# Patient Record
Sex: Male | Born: 1975 | Race: White | Hispanic: No | Marital: Single | Smoking: Current every day smoker
Health system: Southern US, Community
[De-identification: ages and names within clinical notes are randomized; demographics above are authoritative.]

## PROBLEM LIST (undated history)

## (undated) DIAGNOSIS — W3400XA Accidental discharge from unspecified firearms or gun, initial encounter: Secondary | ICD-10-CM

## (undated) HISTORY — PX: ANKLE FRACTURE SURGERY: SHX122

## (undated) HISTORY — PX: OTHER SURGICAL HISTORY: SHX169

---

## 1997-06-20 ENCOUNTER — Emergency Department (HOSPITAL_COMMUNITY): Admission: EM | Admit: 1997-06-20 | Discharge: 1997-06-20 | Payer: Self-pay | Admitting: *Deleted

## 1997-08-25 ENCOUNTER — Emergency Department (HOSPITAL_COMMUNITY): Admission: EM | Admit: 1997-08-25 | Discharge: 1997-08-25 | Payer: Self-pay | Admitting: Emergency Medicine

## 1998-02-14 ENCOUNTER — Emergency Department (HOSPITAL_COMMUNITY): Admission: EM | Admit: 1998-02-14 | Discharge: 1998-02-14 | Payer: Self-pay | Admitting: Emergency Medicine

## 1998-02-15 ENCOUNTER — Encounter: Payer: Self-pay | Admitting: Emergency Medicine

## 1998-02-17 ENCOUNTER — Emergency Department (HOSPITAL_COMMUNITY): Admission: EM | Admit: 1998-02-17 | Discharge: 1998-02-17 | Payer: Self-pay | Admitting: Emergency Medicine

## 1998-02-23 ENCOUNTER — Emergency Department (HOSPITAL_COMMUNITY): Admission: EM | Admit: 1998-02-23 | Discharge: 1998-02-23 | Payer: Self-pay | Admitting: Emergency Medicine

## 1998-02-27 ENCOUNTER — Emergency Department (HOSPITAL_COMMUNITY): Admission: EM | Admit: 1998-02-27 | Discharge: 1998-02-27 | Payer: Self-pay | Admitting: Emergency Medicine

## 2004-04-22 ENCOUNTER — Inpatient Hospital Stay (HOSPITAL_COMMUNITY): Admission: EM | Admit: 2004-04-22 | Discharge: 2004-04-23 | Payer: Self-pay | Admitting: Emergency Medicine

## 2004-07-07 ENCOUNTER — Emergency Department (HOSPITAL_COMMUNITY): Admission: EM | Admit: 2004-07-07 | Discharge: 2004-07-07 | Payer: Self-pay | Admitting: Emergency Medicine

## 2006-05-15 ENCOUNTER — Inpatient Hospital Stay (HOSPITAL_COMMUNITY): Admission: AC | Admit: 2006-05-15 | Discharge: 2006-05-26 | Payer: Self-pay

## 2006-05-23 ENCOUNTER — Ambulatory Visit: Payer: Self-pay | Admitting: Vascular Surgery

## 2007-03-20 ENCOUNTER — Inpatient Hospital Stay (HOSPITAL_COMMUNITY): Admission: RE | Admit: 2007-03-20 | Discharge: 2007-03-23 | Payer: Self-pay | Admitting: General Surgery
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
## 2010-11-15 LAB — BASIC METABOLIC PANEL WITH GFR
BUN: 16
CO2: 27
Calcium: 8.5
Chloride: 104
Creatinine, Ser: 0.92
GFR calc non Af Amer: >60
Glucose, Bld: 147 — ABNORMAL HIGH
Potassium: 4.5
Sodium: 136

## 2010-11-15 LAB — CBC
HCT: 37.5 — ABNORMAL LOW
HCT: 43.9
Hemoglobin: 13
Hemoglobin: 15.1
MCHC: 34.3
MCHC: 34.8
MCV: 95.1
MCV: 95.4
MCV: 95.6
Platelets: 132 — ABNORMAL LOW
Platelets: 161
Platelets: 162
RBC: 3.81 — ABNORMAL LOW
RBC: 3.95 — ABNORMAL LOW
RBC: 4.6
RDW: 14.1
RDW: 14.3
WBC: 13.8 — ABNORMAL HIGH
WBC: 6.4
WBC: 7.8

## 2010-11-15 LAB — DIFFERENTIAL
Basophils Absolute: 0
Basophils Absolute: 0
Basophils Relative: 0
Basophils Relative: 0
Eosinophils Absolute: 0
Eosinophils Absolute: 0.1
Eosinophils Relative: 0
Eosinophils Relative: 1
Lymphocytes Relative: 20
Lymphocytes Relative: 6 — ABNORMAL LOW
Lymphs Abs: 0.8
Lymphs Abs: 1.6
Monocytes Absolute: 0.9
Monocytes Absolute: 1.2 — ABNORMAL HIGH
Monocytes Relative: 12
Monocytes Relative: 9
Neutro Abs: 11.8 — ABNORMAL HIGH
Neutro Abs: 5.2
Neutrophils Relative %: 66
Neutrophils Relative %: 86 — ABNORMAL HIGH

## 2010-11-15 LAB — BASIC METABOLIC PANEL
Chloride: 95 — ABNORMAL LOW
Creatinine, Ser: 0.93
GFR calc Af Amer: >60
GFR calc non Af Amer: >60
Potassium: 3.6

## 2014-12-06 ENCOUNTER — Emergency Department (HOSPITAL_COMMUNITY): Payer: Self-pay

## 2014-12-06 ENCOUNTER — Emergency Department (HOSPITAL_COMMUNITY)
Admission: EM | Admit: 2014-12-06 | Discharge: 2014-12-06 | Disposition: A | Discharge status: Home / Self Care | Payer: Self-pay | Attending: Emergency Medicine | Admitting: Emergency Medicine

## 2014-12-06 ENCOUNTER — Encounter (HOSPITAL_COMMUNITY): Payer: Self-pay | Admitting: Emergency Medicine

## 2014-12-06 DIAGNOSIS — Y9389 Activity, other specified: Secondary | ICD-10-CM | POA: Insufficient documentation

## 2014-12-06 DIAGNOSIS — Z720 Tobacco use: Secondary | ICD-10-CM | POA: Insufficient documentation

## 2014-12-06 DIAGNOSIS — W1842XA Slipping, tripping and stumbling without falling due to stepping into hole or opening, initial encounter: Secondary | ICD-10-CM | POA: Insufficient documentation

## 2014-12-06 DIAGNOSIS — S8261XA Displaced fracture of lateral malleolus of right fibula, initial encounter for closed fracture: Secondary | ICD-10-CM | POA: Insufficient documentation

## 2014-12-06 DIAGNOSIS — Y9289 Other specified places as the place of occurrence of the external cause: Secondary | ICD-10-CM | POA: Insufficient documentation

## 2014-12-06 DIAGNOSIS — Y998 Other external cause status: Secondary | ICD-10-CM | POA: Insufficient documentation

## 2014-12-06 MED ORDER — DIPHENHYDRAMINE HCL 25 MG PO CAPS
25.0000 mg | ORAL_CAPSULE | Freq: Once | ORAL | Status: AC
  Administered 2014-12-06: 25 mg via ORAL
  Filled 2014-12-06: qty 1

## 2014-12-06 MED ORDER — HYDROCODONE-ACETAMINOPHEN 5-325 MG PO TABS: 1.0000 | ORAL_TABLET | Freq: Four times a day (QID) | ORAL | Status: DC | PRN

## 2014-12-06 MED ORDER — OXYCODONE-ACETAMINOPHEN 5-325 MG PO TABS
1.0000 | ORAL_TABLET | Freq: Once | ORAL | Status: AC
Start: 2014-12-06 — End: 2014-12-06
  Administered 2014-12-06: 1 via ORAL
  Filled 2014-12-06: qty 1

## 2014-12-06 MED ORDER — IBUPROFEN 400 MG PO TABS
800.0000 mg | ORAL_TABLET | Freq: Once | ORAL | Status: AC
  Administered 2014-12-06: 800 mg via ORAL
  Filled 2014-12-06: qty 2

## 2014-12-06 MED ORDER — NAPROXEN 500 MG PO TABS: 500.0000 mg | ORAL_TABLET | Freq: Two times a day (BID) | ORAL | Status: DC

## 2014-12-06 NOTE — ED Notes (Signed)
Pt sts right ankle pain and swelling after twisting last night

## 2014-12-06 NOTE — Progress Notes (Signed)
Orthopedic Tech Progress Note Patient Details:  Christopher Sheppard 07/20/1975 [id number]  Ortho Devices Type of Ortho Device: Ace wrap, Post (short leg) splint Ortho Device/Splint Location: RLE Ortho Device/Splint Interventions: Ordered, Application Patient has own crutches  [name] 12/06/2014, 9:26 PM
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
## 2014-12-06 NOTE — ED Provider Notes (Signed)
CSN: [id number]     Arrival date & time 12/06/14  1746 History  By signing my name below, I, Ronney Lion, attest that this documentation has been prepared under the direction and in the presence of Kerrie Buffalo, NP. Electronically Signed: Ronney Lion, ED Scribe. 12/06/2014. 8:12 PM.    Chief Complaint  Patient presents with  . Ankle Pain   Patient is a 39 y.o. male presenting with ankle pain. The history is provided by the patient. No language interpreter was used.  Ankle Pain Location:  Ankle Ankle location:  R ankle Pain details:    Quality:  Sharp and shooting   Radiates to:  Does not radiate   Severity:  Moderate   Onset quality:  Sudden   Timing:  Constant   Progression:  Worsening Chronicity:  New Dislocation: no   Relieved by:  Ice and elevation Worsened by:  Nothing tried Ineffective treatments:  None tried Associated symptoms: swelling    HPI Comments: PATSY VARMA is a 39 y.o. male who presents to the Emergency Department complaining of sudden-onset right ankle pain and swelling after stepping in a hole and rolling his ankle last night, about 20 hours ago - he reports he immediately heard a "snap." Patient reports he walked a mile afterwards. He states he has been elevating and icing the area with mild relief. Patient reports he last saw orthopedist Dr. Rennis Chris, 8-10 years ago.  History reviewed. No pertinent past medical history. History reviewed. No pertinent past surgical history. History reviewed. No pertinent family history. Social History  Substance Use Topics  . Smoking status: Current Every Day Smoker  . Smokeless tobacco: None  . Alcohol Use: Yes    Review of Systems  Musculoskeletal: Positive for arthralgias (right ankle pain).  All other systems reviewed and are negative.   Allergies  Review of patient's allergies indicates no known allergies.  Home Medications   Prior to Admission medications   Medication Sig Start Date End Date Taking? Authorizing  Provider  HYDROcodone-acetaminophen (NORCO) 5-325 MG tablet Take 1-2 tablets by mouth every 6 (six) hours as needed. 12/06/14   Yesenia Locurto Orlene Och, NP  naproxen (NAPROSYN) 500 MG tablet Take 1 tablet (500 mg total) by mouth 2 (two) times daily. 12/06/14   Tacoya Altizer Orlene Och, NP   BP 134/71 mmHg  Pulse 85  Temp(Src) 98.4 F (36.9 C) (Oral)  Resp 16  SpO2 99% Physical Exam  Constitutional: He is oriented to person, place, and time. He appears well-developed and well-nourished. No distress.  HENT:  Head: Normocephalic and atraumatic.  Eyes: Conjunctivae and EOM are normal.  Neck: Neck supple. No tracheal deviation present.  Cardiovascular: Normal rate.   Pulmonary/Chest: Effort normal. No respiratory distress.  Musculoskeletal:       Right ankle: He exhibits decreased range of motion and swelling. He exhibits no laceration and normal pulse. Tenderness. Lateral malleolus tenderness found. Achilles tendon normal.  2+ pulses. Adequate circulation.   Neurological: He is alert and oriented to person, place, and time.  Skin: Skin is warm and dry.  Psychiatric: He has a normal mood and affect. His behavior is normal.  Nursing note and vitals reviewed.   ED Course  Procedures (including critical care time) Consult with Dr. August Saucer, ortho,  Posterior splint, crutches, ice, elevation and call the office for appointment to be seen on Thursday 12/08/14  DIAGNOSTIC STUDIES: Oxygen Saturation is 100% on RA, normal by my interpretation.    COORDINATION OF CARE: 7:27 PM - Discussed  treatment plan with pt at bedside which includes pain medication and f/u with [facility]. Pt verbalized understanding and agreed to plan.   Imaging Review Dg Ankle Complete Right  12/06/2014  CLINICAL DATA:  39 year old male twisting injury last night with pain. Previous right tibia fracture with ORIF. Initial encounter. EXAM: RIGHT ANKLE - COMPLETE 3+ VIEW COMPARISON:  Intraoperative radiographs 04/22/2004 FINDINGS:  Right tibia intra medullary rod partially visible. Healed oblique/spiral fracture of the distal third right tibia shaft. Soft tissue swelling about the ankle with mildly comminuted transverse fracture through the lateral malleolus. Mortise joint alignment preserved. Small ankle joint effusion. Talar dome intact. Calcaneus intact. No other acute fracture. IMPRESSION: 1. Mildly comminuted minimally displaced transverse fracture lateral malleolus. Small joint effusion. 2. No other acute osseous abnormality identified. Electronically Signed   By: Odessa Fleming M.D.   On: 12/06/2014 18:51   I have personally reviewed and evaluated these images as part of my medical decision-making.  MDM  39 y.o. male with pain and swelling to the right lateral malleolus stable for d/c without neurovascular deficits and without blisters to the skin. Discussed with the patient clinical and x-ray findings and plan of care and all questioned fully answered.    Final diagnoses:  Lateral malleolar fracture, right, closed, initial encounter   I personally performed the services described in this documentation, which was scribed in my presence. The recorded information has been reviewed and is accurate.     [address] [place], [place] 12/07/14 2320  Linwood Dibbles, MD 12/08/14 2156

## 2014-12-06 NOTE — Discharge Instructions (Signed)
Call Dr. Diamantina Providence office in the morning and tell them you were seen in the ED with an ankle fracture and Dr. August Saucer wants you to follow up in the office on Thursday.  Ankle Fracture A fracture is a break in a bone. The ankle joint is made up of three bones. These include the lower (distal)sections of your lower leg bones, called the tibia and fibula, along with a bone in your foot, called the talus. Depending on how bad the break is and if more than one ankle joint bone is broken, a cast or splint is used to protect and keep your injured bone from moving while it heals. Sometimes, surgery is required to help the fracture heal properly.  There are two general types of fractures:  Stable fracture. This includes a single fracture line through one bone, with no injury to ankle ligaments. A fracture of the talus that does not have any displacement (movement of the bone on either side of the fracture line) is also stable.  Unstable fracture. This includes more than one fracture line through one or more bones in the ankle joint. It also includes fractures that have displacement of the bone on either side of the fracture line. CAUSES  A direct blow to the ankle.   Quickly and severely twisting your ankle.  Trauma, such as a car accident or falling from a significant height. RISK FACTORS You may be at a higher risk of ankle fracture if:  You have certain medical conditions.  You are involved in high-impact sports.  You are involved in a high-impact car accident. SIGNS AND SYMPTOMS   Tender and swollen ankle.  Bruising around the injured ankle.  Pain on movement of the ankle.  Difficulty walking or putting weight on the ankle.  A cold foot below the site of the ankle injury. This can occur if the blood vessels passing through your injured ankle were also damaged.  Numbness in the foot below the site of the ankle injury. DIAGNOSIS  An ankle fracture is usually diagnosed with a physical exam  and X-rays. A CT scan may also be required for complex fractures. TREATMENT  Stable fractures are treated with a cast or splint and using crutches to avoid putting weight on your injured ankle. This is followed by an ankle strengthening program. Some patients require a special type of cast, depending on other medical problems they may have. Unstable fractures require surgery to ensure the bones heal properly. Your health care provider will tell you what type of fracture you have and the best treatment for your condition. HOME CARE INSTRUCTIONS   Review correct crutch use with your health care provider and use your crutches as directed. Safe use of crutches is extremely important. Misuse of crutches can cause you to fall or cause injury to nerves in your hands or armpits.  Do not put weight or pressure on the injured ankle until directed by your health care provider.  To lessen the swelling, keep the injured leg elevated while sitting or lying down.  Apply ice to the injured area:  Put ice in a plastic bag.  Place a towel between your cast and the bag.  Leave the ice on for 20 minutes, 2-3 times a day.  If you have a plaster or fiberglass cast:  Do not try to scratch the skin under the cast with any objects. This can increase your risk of skin infection.  Check the skin around the cast every day. You may  put lotion on any red or sore areas.  Keep your cast dry and clean.  If you have a plaster splint:  Wear the splint as directed.  You may loosen the elastic around the splint if your toes become numb, tingle, or turn cold or blue.  Do not put pressure on any part of your cast or splint; it may break. Rest your cast only on a pillow the first 24 hours until it is fully hardened.  Your cast or splint can be protected during bathing with a plastic bag sealed to your skin with medical tape. Do not lower the cast or splint into water.  Take medicines as directed by your health care  provider. Only take over-the-counter or prescription medicines for pain, discomfort, or fever as directed by your health care provider.  Do not drive a vehicle until your health care provider specifically tells you it is safe to do so.  If your health care provider has given you a follow-up appointment, it is very important to keep that appointment. Not keeping the appointment could result in a chronic or permanent injury, pain, and disability. If you have any problem keeping the appointment, call the facility for assistance. SEEK MEDICAL CARE IF: You develop increased swelling or discomfort. SEEK IMMEDIATE MEDICAL CARE IF:   Your cast gets damaged or breaks.  You have continued severe pain.  You develop new pain or swelling after the cast was put on.  Your skin or toenails below the injury turn blue or gray.  Your skin or toenails below the injury feel cold, numb, or have loss of sensitivity to touch.  There is a bad smell or pus draining from under the cast. MAKE SURE YOU:   Understand these instructions.  Will watch your condition.  Will get help right away if you are not doing well or get worse.   This information is not intended to replace advice given to you by your health care provider. Make sure you discuss any questions you have with your health care provider.   Document Released: 02/09/2000 Document Revised: 02/16/2013 Document Reviewed: 09/10/2012 Elsevier Interactive Patient Education 2016 [employer]  Cast or Splint Care Casts and splints support injured limbs and keep bones from moving while they heal. It is important to care for your cast or splint at home.  HOME CARE INSTRUCTIONS  Keep the cast or splint uncovered during the drying period. It can take 24 to 48 hours to dry if it is made of plaster. A fiberglass cast will dry in less than 1 hour.  Do not rest the cast on anything harder than a pillow for the first 24 hours.  Do not put weight on your  injured limb or apply pressure to the cast until your health care provider gives you permission.  Keep the cast or splint dry. Wet casts or splints can lose their shape and may not support the limb as well. A wet cast that has lost its shape can also create harmful pressure on your skin when it dries. Also, wet skin can become infected.  Cover the cast or splint with a plastic bag when bathing or when out in the rain or snow. If the cast is on the trunk of the body, take sponge baths until the cast is removed.  If your cast does become wet, dry it with a towel or a blow dryer on the cool setting only.  Keep your cast or splint clean. Soiled casts may be wiped  with a moistened cloth.  Do not place any hard or soft foreign objects under your cast or splint, such as cotton, toilet paper, lotion, or powder.  Do not try to scratch the skin under the cast with any object. The object could get stuck inside the cast. Also, scratching could lead to an infection. If itching is a problem, use a blow dryer on a cool setting to relieve discomfort.  Do not trim or cut your cast or remove padding from inside of it.  Exercise all joints next to the injury that are not immobilized by the cast or splint. For example, if you have a long leg cast, exercise the hip joint and toes. If you have an arm cast or splint, exercise the shoulder, elbow, thumb, and fingers.  Elevate your injured arm or leg on 1 or 2 pillows for the first 1 to 3 days to decrease swelling and pain.It is best if you can comfortably elevate your cast so it is higher than your heart. SEEK MEDICAL CARE IF:   Your cast or splint cracks.  Your cast or splint is too tight or too loose.  You have unbearable itching inside the cast.  Your cast becomes wet or develops a soft spot or area.  You have a bad smell coming from inside your cast.  You get an object stuck under your cast.  Your skin around the cast becomes red or raw.  You have  new pain or worsening pain after the cast has been applied. SEEK IMMEDIATE MEDICAL CARE IF:   You have fluid leaking through the cast.  You are unable to move your fingers or toes.  You have discolored (blue or white), cool, painful, or very swollen fingers or toes beyond the cast.  You have tingling or numbness around the injured area.  You have severe pain or pressure under the cast.  You have any difficulty with your breathing or have shortness of breath.  You have chest pain.   This information is not intended to replace advice given to you by your health care provider. Make sure you discuss any questions you have with your health care provider.   Document Released: 02/09/2000 Document Revised: 12/02/2012 Document Reviewed: 08/20/2012 Elsevier Interactive Patient Education [employer]

## 2015-10-12 ENCOUNTER — Encounter (HOSPITAL_COMMUNITY): Payer: Self-pay | Admitting: Emergency Medicine

## 2015-10-12 ENCOUNTER — Emergency Department (HOSPITAL_COMMUNITY)
Admission: EM | Admit: 2015-10-12 | Discharge: 2015-10-12 | Disposition: A | Discharge status: Home / Self Care | Payer: Self-pay | Attending: Emergency Medicine | Admitting: Emergency Medicine

## 2015-10-12 ENCOUNTER — Emergency Department (HOSPITAL_COMMUNITY): Payer: Self-pay

## 2015-10-12 DIAGNOSIS — F17200 Nicotine dependence, unspecified, uncomplicated: Secondary | ICD-10-CM | POA: Insufficient documentation

## 2015-10-12 DIAGNOSIS — Y929 Unspecified place or not applicable: Secondary | ICD-10-CM | POA: Insufficient documentation

## 2015-10-12 DIAGNOSIS — Z23 Encounter for immunization: Secondary | ICD-10-CM | POA: Insufficient documentation

## 2015-10-12 DIAGNOSIS — Y9389 Activity, other specified: Secondary | ICD-10-CM | POA: Insufficient documentation

## 2015-10-12 DIAGNOSIS — M25571 Pain in right ankle and joints of right foot: Secondary | ICD-10-CM | POA: Insufficient documentation

## 2015-10-12 DIAGNOSIS — Y999 Unspecified external cause status: Secondary | ICD-10-CM | POA: Insufficient documentation

## 2015-10-12 DIAGNOSIS — S0001XA Abrasion of scalp, initial encounter: Secondary | ICD-10-CM | POA: Insufficient documentation

## 2015-10-12 HISTORY — DX: Accidental discharge from unspecified firearms or gun, initial encounter: W34.00XA

## 2015-10-12 MED ORDER — TETANUS-DIPHTH-ACELL PERTUSSIS 5-2.5-18.5 LF-MCG/0.5 IM SUSP
0.5000 mL | Freq: Once | INTRAMUSCULAR | Status: AC
  Administered 2015-10-12: 0.5 mL via INTRAMUSCULAR
  Filled 2015-10-12: qty 0.5

## 2015-10-12 MED ORDER — IBUPROFEN 800 MG PO TABS: 800.0000 mg | ORAL_TABLET | Freq: Three times a day (TID) | ORAL | 0 refills | Status: AC | PRN

## 2015-10-12 MED ORDER — IBUPROFEN 200 MG PO TABS
600.0000 mg | ORAL_TABLET | Freq: Once | ORAL | Status: AC
  Administered 2015-10-12: 600 mg via ORAL
  Filled 2015-10-12: qty 3

## 2015-10-12 NOTE — Discharge Instructions (Signed)
Read the information below.  Use the prescribed medication as directed.  Please discuss all new medications with your pharmacist.  You may return to the Emergency Department at any time for worsening condition or any new symptoms that concern you.    ° °You have had a head injury which does not appear to require admission at this time. A concussion is a state of changed mental ability from trauma. °SEEK IMMEDIATE MEDICAL ATTENTION IF: °There is confusion or drowsiness (although children frequently become drowsy after injury).  °You cannot awaken the injured person.  °There is nausea (feeling sick to your stomach) or continued, forceful vomiting.  °You notice dizziness or unsteadiness which is getting worse, or inability to walk.  °You have convulsions or unconsciousness.  °You experience severe, persistent headaches not relieved by Tylenol?. (Do not take aspirin as this impairs clotting abilities). Take other pain medications only as directed.  °You cannot use arms or legs normally.  °There are changes in pupil sizes. (This is the black center in the colored part of the eye)  °There is clear or bloody discharge from the nose or ears.  °Change in speech, vision, swallowing, or understanding.  °Localized weakness, numbness, tingling, or change in bowel or bladder control.  °
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
## 2017-01-15 ENCOUNTER — Other Ambulatory Visit: Payer: Self-pay

## 2017-01-15 ENCOUNTER — Emergency Department (HOSPITAL_COMMUNITY): Payer: Self-pay

## 2017-01-15 ENCOUNTER — Emergency Department (HOSPITAL_COMMUNITY)
Admission: EM | Admit: 2017-01-15 | Discharge: 2017-01-15 | Disposition: A | Discharge status: Home / Self Care | Payer: Self-pay | Attending: Emergency Medicine | Admitting: Emergency Medicine

## 2017-01-15 ENCOUNTER — Encounter (HOSPITAL_COMMUNITY): Payer: Self-pay | Admitting: Emergency Medicine

## 2017-01-15 DIAGNOSIS — Y999 Unspecified external cause status: Secondary | ICD-10-CM | POA: Insufficient documentation

## 2017-01-15 DIAGNOSIS — F17200 Nicotine dependence, unspecified, uncomplicated: Secondary | ICD-10-CM | POA: Insufficient documentation

## 2017-01-15 DIAGNOSIS — S01511A Laceration without foreign body of lip, initial encounter: Secondary | ICD-10-CM | POA: Insufficient documentation

## 2017-01-15 DIAGNOSIS — Y939 Activity, unspecified: Secondary | ICD-10-CM | POA: Insufficient documentation

## 2017-01-15 DIAGNOSIS — Y929 Unspecified place or not applicable: Secondary | ICD-10-CM | POA: Insufficient documentation

## 2017-01-15 MED ORDER — LIDOCAINE HCL 2 % IJ SOLN
10.0000 mL | Freq: Once | INTRAMUSCULAR | Status: AC
  Administered 2017-01-15: 200 mg
  Filled 2017-01-15: qty 20

## 2017-01-15 MED ORDER — AMOXICILLIN-POT CLAVULANATE 875-125 MG PO TABS: 1.0000 | ORAL_TABLET | Freq: Two times a day (BID) | ORAL | 0 refills | Status: DC

## 2017-01-15 MED ORDER — ACETAMINOPHEN 325 MG PO TABS
650.0000 mg | ORAL_TABLET | Freq: Once | ORAL | Status: DC
  Filled 2017-01-15: qty 2

## 2017-01-15 MED ORDER — ACETAMINOPHEN 325 MG PO TABS
650.0000 mg | ORAL_TABLET | Freq: Once | ORAL | Status: AC
  Administered 2017-01-15: 650 mg via ORAL
  Filled 2017-01-15: qty 2

## 2017-01-15 MED ORDER — AMOXICILLIN-POT CLAVULANATE 875-125 MG PO TABS
1.0000 | ORAL_TABLET | Freq: Once | ORAL | Status: AC
  Administered 2017-01-15: 1 via ORAL
  Filled 2017-01-15: qty 1
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
## 2017-01-15 NOTE — Discharge Instructions (Signed)
Follow-up with a dentist from the attached list.  As we discussed your teeth may or may not be salvageable.  Take the antibiotics as prescribed.  The sutures on your lip should resolve on their own.  Return to the ED if you develop new or worsening symptoms.

## 2017-01-15 NOTE — ED Provider Notes (Signed)
LACERATION REPAIR Performed by: Chase PicketJaime Pilcher Ward Consent: Verbal consent obtained. Risks and benefits: risks, benefits and alternatives were discussed Patient identity confirmed: provided demographic data Time out performed prior to procedure Prepped and Draped in normal sterile fashion Wound explored Laceration Location: Lip Laceration Length: 1 cm to outer lower lip, 2.5 cm to inner lower lip No Foreign Bodies seen or palpated on wound exploration Anesthesia: local infiltration Local anesthetic: lidocaine 2% with epinephrine Anesthetic total: 2 ml Irrigation method: syringe Amount of cleaning: standard Skin closure: 5-0  Number of sutures or staples: 2 simple interrupted to outer lip and 2 simple interrupted to inner lip [place] border well aligned.  Patient tolerance: Patient tolerated the procedure well with no immediate complications.    Ward, Chase PicketJaime Pilcher, PA-C 01/15/17 0430    Glynn Octaveancour, Stephen, MD 01/15/17 [phone]
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
## 2023-09-15 ENCOUNTER — Ambulatory Visit: Payer: Self-pay

## 2023-09-15 NOTE — Telephone Encounter (Signed)
 FYI Only or Action Required?: FYI only for provider.  Patient was last seen in primary care on no pcp.  Called Nurse Triage reporting Covid Positive unknown covid status.  Symptoms began Unsure.  Interventions attempted: Other: Unknown.  Symptoms are: rapidly improving.  Triage Disposition: Call PCP When Office is Open  Patient/caregiver understands and will follow disposition?: pt will get a home COVID test or go to [facility].              Copied from CRM [id number]. Topic: Clinical - Red Word Triage >> Sep 15, 2023  1:54 PM Deleta RAMAN wrote: Red Word that prompted transfer to Nurse Triage: patient believe he has Covid-19.been experiencing bad fever and weakness Reason for Disposition  [1] COVID-19 infection suspected by caller or triager AND [2] mild symptoms (cough, fever, or others) AND [3] negative COVID-19 rapid test  Answer Assessment - Initial Assessment Questions 1. COVID-19 DIAGNOSIS: How do you know that you have COVID? (e.g., positive lab test or self-test, diagnosed by doctor or NP/PA, symptoms after exposure).     Thinks he has COVid 2. COVID-19 EXPOSURE: Was there any known exposure to COVID before the symptoms began? CDC Definition of close contact: within 6 feet (2 meters) for a total of 15 minutes or more over a 24-hour period.      unknown 3. ONSET: When did the COVID-19 symptoms start?      unsure 4. WORST SYMPTOM: What is your worst symptom? (e.g., cough, fever, shortness of breath, muscle aches)     weakness 5. COUGH: Do you have a cough? If Yes, ask: How bad is the cough?       unknown 6. FEVER: Do you have a fever? If Yes, ask: What is your temperature, how was it measured, and when did it start?     Fever has broken  Protocols used: Coronavirus (COVID-19) Diagnosed or Suspected-A-AH

## 2023-09-19 ENCOUNTER — Emergency Department (HOSPITAL_COMMUNITY)

## 2023-09-19 ENCOUNTER — Other Ambulatory Visit: Payer: Self-pay

## 2023-09-19 ENCOUNTER — Encounter (HOSPITAL_COMMUNITY): Payer: Self-pay

## 2023-09-19 ENCOUNTER — Inpatient Hospital Stay (HOSPITAL_COMMUNITY)
Admission: EM | Admit: 2023-09-19 | Discharge: 2023-09-20 | DRG: 194 | Disposition: A | Discharge status: Home / Self Care | Source: Ambulatory Visit | Attending: Internal Medicine | Admitting: Internal Medicine

## 2023-09-19 DIAGNOSIS — J189 Pneumonia, unspecified organism: Principal | ICD-10-CM | POA: Diagnosis present

## 2023-09-19 DIAGNOSIS — R042 Hemoptysis: Secondary | ICD-10-CM | POA: Diagnosis present

## 2023-09-19 DIAGNOSIS — I10 Essential (primary) hypertension: Secondary | ICD-10-CM | POA: Diagnosis present

## 2023-09-19 DIAGNOSIS — Z79899 Other long term (current) drug therapy: Secondary | ICD-10-CM | POA: Diagnosis not present

## 2023-09-19 DIAGNOSIS — R634 Abnormal weight loss: Secondary | ICD-10-CM | POA: Diagnosis present

## 2023-09-19 DIAGNOSIS — Z91048 Other nonmedicinal substance allergy status: Secondary | ICD-10-CM | POA: Diagnosis not present

## 2023-09-19 DIAGNOSIS — Z91030 Bee allergy status: Secondary | ICD-10-CM

## 2023-09-19 DIAGNOSIS — I340 Nonrheumatic mitral (valve) insufficiency: Secondary | ICD-10-CM | POA: Diagnosis present

## 2023-09-19 DIAGNOSIS — I421 Obstructive hypertrophic cardiomyopathy: Secondary | ICD-10-CM | POA: Diagnosis present

## 2023-09-19 DIAGNOSIS — F1010 Alcohol abuse, uncomplicated: Secondary | ICD-10-CM | POA: Diagnosis present

## 2023-09-19 DIAGNOSIS — Z8249 Family history of ischemic heart disease and other diseases of the circulatory system: Secondary | ICD-10-CM

## 2023-09-19 DIAGNOSIS — F17200 Nicotine dependence, unspecified, uncomplicated: Secondary | ICD-10-CM | POA: Diagnosis present

## 2023-09-19 DIAGNOSIS — Z1152 Encounter for screening for COVID-19: Secondary | ICD-10-CM

## 2023-09-19 DIAGNOSIS — Z6825 Body mass index (BMI) 25.0-25.9, adult: Secondary | ICD-10-CM | POA: Diagnosis not present

## 2023-09-19 DIAGNOSIS — K7010 Alcoholic hepatitis without ascites: Secondary | ICD-10-CM | POA: Diagnosis present

## 2023-09-19 DIAGNOSIS — K760 Fatty (change of) liver, not elsewhere classified: Secondary | ICD-10-CM | POA: Diagnosis present

## 2023-09-19 LAB — CBC WITH DIFFERENTIAL/PLATELET
Abs Immature Granulocytes: 0.09 K/uL — ABNORMAL HIGH (ref 0.00–0.07)
Basophils Absolute: 0 K/uL (ref 0.0–0.1)
Basophils Relative: 1 %
Eosinophils Absolute: 0.2 K/uL (ref 0.0–0.5)
Eosinophils Relative: 2 %
HCT: 42.1 % (ref 39.0–52.0)
Hemoglobin: 14.2 g/dL (ref 13.0–17.0)
Immature Granulocytes: 1 %
Lymphocytes Relative: 14 %
Lymphs Abs: 1.2 K/uL (ref 0.7–4.0)
MCH: 32.9 pg (ref 26.0–34.0)
MCHC: 33.7 g/dL (ref 30.0–36.0)
MCV: 97.5 fL (ref 80.0–100.0)
Monocytes Absolute: 0.5 K/uL (ref 0.1–1.0)
Monocytes Relative: 6 %
Neutro Abs: 6.5 K/uL (ref 1.7–7.7)
Neutrophils Relative %: 76 %
Platelets: 197 K/uL (ref 150–400)
RBC: 4.32 MIL/uL (ref 4.22–5.81)
RDW: 13.4 % (ref 11.5–15.5)
WBC: 8.4 K/uL (ref 4.0–10.5)
nRBC: 0 % (ref 0.0–0.2)

## 2023-09-19 LAB — COMPREHENSIVE METABOLIC PANEL WITH GFR
ALT: 159 U/L — ABNORMAL HIGH (ref 0–44)
AST: 239 U/L — ABNORMAL HIGH (ref 15–41)
Albumin: 2.5 g/dL — ABNORMAL LOW (ref 3.5–5.0)
Alkaline Phosphatase: 93 U/L (ref 38–126)
Anion gap: 11 (ref 5–15)
BUN: 18 mg/dL (ref 6–20)
CO2: 22 mmol/L (ref 22–32)
Calcium: 8.2 mg/dL — ABNORMAL LOW (ref 8.9–10.3)
Chloride: 98 mmol/L (ref 98–111)
Creatinine, Ser: 0.84 mg/dL (ref 0.61–1.24)
GFR, Estimated: >60 mL/min (ref >60)
Glucose, Bld: 127 mg/dL — ABNORMAL HIGH (ref 70–99)
Potassium: 3.6 mmol/L (ref 3.5–5.1)
Sodium: 131 mmol/L — ABNORMAL LOW (ref 135–145)
Total Bilirubin: 1 mg/dL (ref 0.0–1.2)
Total Protein: 6.7 g/dL (ref 6.5–8.1)

## 2023-09-19 LAB — RESP PANEL BY RT-PCR (RSV, FLU A&B, COVID)  RVPGX2
Influenza A by PCR: NEGATIVE
Influenza B by PCR: NEGATIVE
Resp Syncytial Virus by PCR: NEGATIVE
SARS Coronavirus 2 by RT PCR: NEGATIVE

## 2023-09-19 LAB — PROTIME-INR
INR: 1.3 — ABNORMAL HIGH (ref 0.8–1.2)
Prothrombin Time: 17.2 s — ABNORMAL HIGH (ref 11.4–15.2)

## 2023-09-19 LAB — GROUP A STREP BY PCR: Group A Strep by PCR: NOT DETECTED

## 2023-09-19 LAB — LIPASE, BLOOD: Lipase: 28 U/L (ref 11–51)

## 2023-09-19 LAB — MAGNESIUM: Magnesium: 2.4 mg/dL (ref 1.7–2.4)

## 2023-09-19 LAB — TROPONIN I (HIGH SENSITIVITY)
Troponin I (High Sensitivity): 18 ng/L — ABNORMAL HIGH (ref <18)
Troponin I (High Sensitivity): 18 ng/L — ABNORMAL HIGH (ref <18)

## 2023-09-19 LAB — LACTIC ACID, PLASMA
Lactic Acid, Venous: 1 mmol/L (ref 0.5–1.9)
Lactic Acid, Venous: 0.7 mmol/L (ref 0.5–1.9)

## 2023-09-19 MED ORDER — ENOXAPARIN SODIUM 40 MG/0.4ML IJ SOSY
40.0000 mg | PREFILLED_SYRINGE | INTRAMUSCULAR | Status: DC
  Administered 2023-09-20: 40 mg via SUBCUTANEOUS
  Filled 2023-09-19: qty 0.4

## 2023-09-19 MED ORDER — SODIUM CHLORIDE 0.9 % IV SOLN: INTRAVENOUS | Status: AC

## 2023-09-19 MED ORDER — ACETAMINOPHEN 325 MG PO TABS: 650.0000 mg | ORAL_TABLET | Freq: Four times a day (QID) | ORAL | Status: DC | PRN

## 2023-09-19 MED ORDER — KETOROLAC TROMETHAMINE 15 MG/ML IJ SOLN
15.0000 mg | Freq: Once | INTRAMUSCULAR | Status: AC
  Administered 2023-09-19: 15 mg via INTRAVENOUS
  Filled 2023-09-19: qty 1

## 2023-09-19 MED ORDER — ONDANSETRON HCL 4 MG/2ML IJ SOLN: 4.0000 mg | Freq: Four times a day (QID) | INTRAMUSCULAR | Status: DC | PRN

## 2023-09-19 MED ORDER — ONDANSETRON HCL 4 MG PO TABS: 4.0000 mg | ORAL_TABLET | Freq: Four times a day (QID) | ORAL | Status: DC | PRN

## 2023-09-19 MED ORDER — MORPHINE SULFATE (PF) 4 MG/ML IV SOLN
4.0000 mg | Freq: Once | INTRAVENOUS | Status: AC
  Administered 2023-09-19: 4 mg via INTRAVENOUS
  Filled 2023-09-19: qty 1

## 2023-09-19 MED ORDER — ACETAMINOPHEN 650 MG RE SUPP: 650.0000 mg | Freq: Four times a day (QID) | RECTAL | Status: DC | PRN

## 2023-09-19 MED ORDER — SODIUM CHLORIDE 0.9 % IV SOLN
1.0000 g | Freq: Once | INTRAVENOUS | Status: AC
  Administered 2023-09-19: 1 g via INTRAVENOUS
  Filled 2023-09-19: qty 10

## 2023-09-19 MED ORDER — SENNOSIDES-DOCUSATE SODIUM 8.6-50 MG PO TABS: 1.0000 | ORAL_TABLET | Freq: Every evening | ORAL | Status: DC | PRN

## 2023-09-19 MED ORDER — LACTATED RINGERS IV BOLUS
1000.0000 mL | Freq: Once | INTRAVENOUS | Status: AC
  Administered 2023-09-19: 1000 mL via INTRAVENOUS

## 2023-09-19 MED ORDER — IOHEXOL 350 MG/ML SOLN
75.0000 mL | Freq: Once | INTRAVENOUS | Status: AC | PRN
  Administered 2023-09-19: 75 mL via INTRAVENOUS

## 2023-09-19 MED ORDER — SODIUM CHLORIDE 0.9 % IV SOLN
500.0000 mg | Freq: Once | INTRAVENOUS | Status: AC
  Administered 2023-09-19: 500 mg via INTRAVENOUS
  Filled 2023-09-19: qty 5

## 2023-09-19 MED ORDER — BISACODYL 5 MG PO TBEC: 5.0000 mg | DELAYED_RELEASE_TABLET | Freq: Every day | ORAL | Status: DC | PRN

## 2023-09-19 NOTE — ED Triage Notes (Signed)
 Pt presents to ED w/ c/o of hemoptysis. Pt has been experiencing fevers, chills, and generalized fatigue for a couple of weeks. A&Ox4.
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
## 2023-09-19 NOTE — ED Notes (Signed)
 Patient transported to CT

## 2023-09-19 NOTE — ED Provider Notes (Signed)
 [place] EMERGENCY DEPARTMENT AT [facility] Provider Note   CSN: [id number] Arrival date & time: 09/19/23  1718     Patient presents with: Hemoptysis   Christopher Sheppard is a 48 y.o. male for evaluation of hemoptysis.  Patient states he has had 1 week of cough, headache, myalgias, right-sided chest pain, shortness of breath, congestion.  He was seen by urgent care on 09/15/2023 and was told this was likely a viral infection.  He returns today due to persistent symptoms.  Over last 24 hours he has noted hemoptysis, states he is coughing up red chunks that look like tomato pieces.  He also admits to a 8 to 10 pound weight loss over the last week.  He has been taking Tylenol  and ibuprofen  at home.  States he lives in a house with a bunch of people however lives in the basement does not typically interact with them.  He denies any recent incarceration or communal living situation currently.  He is not anticoagulated.  He denies IV drug use.  No neck pain or neck stiffness.  Some nausea without vomiting.  He has some aching upper abdominal pain however relates this to his cough.  No pain or swelling to lower legs.  No history of PE or DVT.  States he feels horrible.  Some generalized weakness. SOB with walking.   HPI     Prior to Admission medications   Medication Sig Start Date End Date Taking? Authorizing Provider  amLODipine  (NORVASC ) 10 MG tablet Take 10 mg by mouth daily. 05/19/23  Yes [provider]  fluticasone  (FLONASE ) 50 MCG/ACT nasal spray Place 1 spray into both nostrils in the morning and at bedtime. 04/08/23  Yes [provider]  loratadine  (CLARITIN ) 10 MG tablet Take 10 mg by mouth daily as needed for allergies. 08/05/23  Yes [provider]  amoxicillin -clavulanate (AUGMENTIN ) 875-125 MG tablet Take 1 tablet by mouth every 12 (twelve) hours. 01/15/17   Rancour, Garnette, MD  cetirizine (ZYRTEC) 10 MG tablet Take 10 mg by mouth daily.     [provider]  HYDROcodone -acetaminophen  (NORCO) 5-325 MG tablet Take 1-2 tablets by mouth every 6 (six) hours as needed. 12/06/14   Jamelle Lorrayne HERO, NP  ibuprofen  (ADVIL ,MOTRIN ) 800 MG tablet Take 1 tablet (800 mg total) by mouth every 8 (eight) hours as needed for mild pain or moderate pain. 10/12/15   Devora Perkins, PA-C  naproxen  (NAPROSYN ) 500 MG tablet Take 1 tablet (500 mg total) by mouth 2 (two) times daily. 12/06/14   Jamelle Lorrayne HERO, NP    Allergies: Bee venom    Review of Systems  Constitutional:  Positive for activity change, appetite change and fever.  HENT:  Positive for congestion.   Respiratory:  Positive for cough and shortness of breath.   Cardiovascular:  Positive for chest pain. Negative for palpitations and leg swelling.  Gastrointestinal:  Positive for abdominal pain and nausea. Negative for abdominal distention, anal bleeding, blood in stool, constipation, diarrhea, rectal pain and vomiting.  Genitourinary: Negative.   Musculoskeletal:  Positive for myalgias.  Skin: Negative.   Neurological:  Positive for weakness (gen weakness) and headaches.  All other systems reviewed and are negative.  Updated Vital Signs BP 122/89   Pulse 73   Temp (!) 101.3 F (38.5 C) (Oral)   Resp 17   SpO2 97%   Physical Exam Vitals and nursing note reviewed.  Constitutional:      General: He is not in acute  distress.    Appearance: He is well-developed. He is ill-appearing and toxic-appearing. He is not diaphoretic.  HENT:     Head: Normocephalic and atraumatic.     Nose: Nose normal.     Mouth/Throat:     Mouth: Mucous membranes are dry.  Eyes:     Pupils: Pupils are equal, round, and reactive to light.  Cardiovascular:     Rate and Rhythm: Normal rate and regular rhythm.     Pulses: Normal pulses.     Heart sounds: Normal heart sounds.  Pulmonary:     Effort: Pulmonary effort is normal. No respiratory distress.     Breath sounds: Rhonchi present.     Comments:  Decreased lung sounds on right lower, mid lung, rhonchi right upper.  Abdominal:     General: Bowel sounds are normal. There is no distension.     Palpations: Abdomen is soft.     Tenderness: There is no abdominal tenderness. There is no right CVA tenderness, left CVA tenderness, guarding or rebound.     Comments: Soft, nontender, no rebound or guarding  Musculoskeletal:        General: No swelling, tenderness, deformity or signs of injury. Normal range of motion.     Cervical back: Normal range of motion and neck supple.     Right lower leg: No edema.     Left lower leg: No edema.  Skin:    General: Skin is warm and dry.     Capillary Refill: Capillary refill takes less than 2 seconds.  Neurological:     General: No focal deficit present.     Mental Status: He is alert and oriented to person, place, and time.     Cranial Nerves: No cranial nerve deficit.     Motor: No weakness.     Gait: Gait normal.     (all labs ordered are listed, but only abnormal results are displayed) Labs Reviewed  CBC WITH DIFFERENTIAL/PLATELET - Abnormal; Notable for the following components:      Result Value   Abs Immature Granulocytes 0.09 (*)    All other components within normal limits  COMPREHENSIVE METABOLIC PANEL WITH GFR - Abnormal; Notable for the following components:   Sodium 131 (*)    Glucose, Bld 127 (*)    Calcium 8.2 (*)    Albumin 2.5 (*)    AST 239 (*)    ALT 159 (*)    All other components within normal limits  PROTIME-INR - Abnormal; Notable for the following components:   Prothrombin Time 17.2 (*)    INR 1.3 (*)    All other components within normal limits  TROPONIN I (HIGH SENSITIVITY) - Abnormal; Notable for the following components:   Troponin I (High Sensitivity) 18 (*)    All other components within normal limits  GROUP A STREP BY PCR  RESP PANEL BY RT-PCR (RSV, FLU A&B, COVID)  RVPGX2  CULTURE, BLOOD (ROUTINE X 2)  CULTURE, BLOOD (ROUTINE X 2)  EXPECTORATED SPUTUM  ASSESSMENT W GRAM STAIN, RFLX TO RESP C  MRSA NEXT GEN BY PCR, NASAL  LIPASE, BLOOD  LACTIC ACID, PLASMA  MAGNESIUM  LACTIC ACID, PLASMA  QUANTIFERON-TB GOLD PLUS  HIV ANTIBODY (ROUTINE TESTING W REFLEX)  LEGIONELLA PNEUMOPHILA SEROGP 1 UR AG  STREP PNEUMONIAE URINARY ANTIGEN  PROCALCITONIN  COMPREHENSIVE METABOLIC PANEL WITH GFR  CBC  TROPONIN I (HIGH SENSITIVITY)    EKG: None  Radiology: CT ABDOMEN PELVIS W CONTRAST Result Date: 09/19/2023 CLINICAL DATA:  Abdomen pain elevated LFT EXAM: CT ABDOMEN AND PELVIS WITH CONTRAST TECHNIQUE: Multidetector CT imaging of the abdomen and pelvis was performed using the standard protocol following bolus administration of intravenous contrast. RADIATION DOSE REDUCTION: This exam was performed according to the departmental dose-optimization program which includes automated exposure control, adjustment of the mA and/or kV according to patient size and/or use of iterative reconstruction technique. CONTRAST:  75mL OMNIPAQUE  IOHEXOL  350 MG/ML SOLN COMPARISON:  CT 05/22/2006 FINDINGS: Lower chest: Lung bases demonstrate extensive airspace disease in the right lower lobe. Cardiomegaly. Hepatobiliary: Hepatic steatosis. No calcified gallstone or biliary dilatation Pancreas: Unremarkable. No pancreatic ductal dilatation or surrounding inflammatory changes. Spleen: Normal in size without focal abnormality. Adrenals/Urinary Tract: Adrenal glands are normal. No hydronephrosis. Atrophy and scarring at the lower pole of left kidney, likely related to sequela of remote posttraumatic injury. Slightly thick walled 11 mm complex cystic area at the midportion of left kidney, with linear bandlike density extending towards the left posterior paraspinal region. Urinary bladder is unremarkable. Stomach/Bowel: Stomach nonenlarged. Postsurgical changes of the jejunum. Postsurgical changes of the transverse colon. No acute bowel inflammatory process. Negative appendix.  Vascular/Lymphatic: Aortic atherosclerosis. No enlarged abdominal or pelvic lymph nodes. Reproductive: Prostate is unremarkable. Other: Negative for pelvic effusion or free air. Musculoskeletal: No acute or suspicious osseous abnormality. IMPRESSION: 1. No CT evidence for acute intra-abdominal or pelvic abnormality. 2. Hepatic steatosis. 3. Atrophy and scarring at the lower pole of left kidney, likely related to sequela of remote posttraumatic injury as was seen on the exam from 2008. Slightly thick walled 11 mm complex cystic area at the midportion of left kidney, with linear bandlike density extending towards the left posterior paraspinal region, suspect that this is also related to sequela of remote trauma but there are no interval exams to assess for recent stability. Recommend comparison with interval exams if available, otherwise short-term cross-sectional imaging follow-up could be considered to ensure stable finding. 4. Extensive airspace disease in the right lower lobe consistent with pneumonia. Cardiomegaly. 5. Aortic atherosclerosis. Aortic Atherosclerosis (ICD10-I70.0). Electronically Signed   By: Luke Bun M.D.   On: 09/19/2023 20:07   CT Angio Chest PE W and/or Wo Contrast Result Date: 09/19/2023 CLINICAL DATA:  Chest pain hemoptysis EXAM: CT ANGIOGRAPHY CHEST WITH CONTRAST TECHNIQUE: Multidetector CT imaging of the chest was performed using the standard protocol during bolus administration of intravenous contrast. Multiplanar CT image reconstructions and MIPs were obtained to evaluate the vascular anatomy. RADIATION DOSE REDUCTION: This exam was performed according to the departmental dose-optimization program which includes automated exposure control, adjustment of the mA and/or kV according to patient size and/or use of iterative reconstruction technique. CONTRAST:  75mL OMNIPAQUE  IOHEXOL  350 MG/ML SOLN COMPARISON:  Chest x-ray 09/19/2023 FINDINGS: Cardiovascular: Satisfactory opacification  of the pulmonary arteries to the segmental level. No evidence of pulmonary embolism. Cardiomegaly. No pericardial effusion. Nonaneurysmal aorta. Mediastinum/Nodes: Patent trachea. No thyroid mass. Multiple borderline to mildly enlarged mediastinal lymph nodes. Right paratracheal nodes measuring up to 11 mm. Esophagus within normal limits. Lungs/Pleura: Extensive consolidation and patchy areas of ground-glass disease throughout the right lower lobe. Additional smaller areas of consolidation and ground-glass disease in the right upper lobe with mild peribronchovascular nodularity and ground-glass disease at the right middle lobe. Left lung grossly clear. No significant pleural effusion. Upper Abdomen: No acute finding. Musculoskeletal: No acute osseous abnormality. Review of the MIP images confirms the above findings. IMPRESSION: 1. Negative for acute pulmonary embolus. 2. Extensive consolidation and patchy areas of ground-glass  disease throughout the right lower lobe with additional smaller areas of consolidation and ground-glass disease in the right upper lobe and right middle lobe. Findings are suspicious for multifocal pneumonia. 3. Cardiomegaly. 4. Multiple borderline to mildly enlarged mediastinal lymph nodes, likely reactive. Electronically Signed   By: Luke Bun M.D.   On: 09/19/2023 19:56   DG Chest Portable 1 View Result Date: 09/19/2023 CLINICAL DATA:  Cough, hemoptysis and weight loss. EXAM: PORTABLE CHEST 1 VIEW COMPARISON:  05/25/2006. FINDINGS: Enlarged cardiac silhouette. Extensive patchy opacity in the lower half of the right lung. Clear left lung. Normal-appearing bones. IMPRESSION: 1. Extensive right lung pneumonia. 2. Cardiomegaly. Electronically Signed   By: Elspeth Bathe M.D.   On: 09/19/2023 18:24     Procedures   Medications Ordered in the ED  acetaminophen  (TYLENOL ) tablet 650 mg (has no administration in time range)    Or  acetaminophen  (TYLENOL ) suppository 650 mg (has no  administration in time range)  senna-docusate (Senokot-S) tablet 1 tablet (has no administration in time range)  bisacodyl  (DULCOLAX) EC tablet 5 mg (has no administration in time range)  ondansetron  (ZOFRAN ) tablet 4 mg (has no administration in time range)    Or  ondansetron  (ZOFRAN ) injection 4 mg (has no administration in time range)  enoxaparin  (LOVENOX ) injection 40 mg (has no administration in time range)  0.9 %  sodium chloride  infusion (has no administration in time range)  lactated ringers  bolus 1,000 mL (1,000 mLs Intravenous Bolus 09/19/23 1754)  ketorolac  (TORADOL ) 15 MG/ML injection 15 mg (15 mg Intravenous Given 09/19/23 1754)  morphine  (PF) 4 MG/ML injection 4 mg (4 mg Intravenous Given 09/19/23 1758)  cefTRIAXone  (ROCEPHIN ) 1 g in sodium chloride  0.9 % 100 mL IVPB (0 g Intravenous Stopped 09/19/23 1940)  azithromycin  (ZITHROMAX ) 500 mg in sodium chloride  0.9 % 250 mL IVPB (0 mg Intravenous Stopped 09/19/23 2132)  iohexol  (OMNIPAQUE ) 350 MG/ML injection 75 mL (75 mLs Intravenous Contrast Given 09/19/23 1945)   48 year old here for evaluation of feeling unwell.  Comes in with fever, hemoptysis, myalgias, unintentional weight loss over the last week or so.  Initially seen by urgent care thought likely to have a viral infection had negative COVID, flu, at that time.  He states symptoms worsening and now having hemoptysis.  He is not anticoagulated.  He has no history of PE or DVT.  Does states he lives in a house with a bunch of people however lives in the basement and does not typically interact with them.  He has no history of TB or known exposure.  Will plan on labs, imaging and reassess  Labs and imaging personally viewed and interpreted:  CBC without leukocytosis Metabolic panel sodium 131, glucose 127, elevated AST, ALT with normal alk phos, T. Bili Mag 2.4 Lipase 28 Lactic 1.0 Troponin 18 NR 1.3 Strep negative COVID, flu, RSV and Chest x-ray with large right sided  pneumonia CTA multifocal pneumonia mild cardiomegaly CT abdomen pelvis no acute abnormality shows an atrophic left kidney EKG with ventricular hypertrophy, long QT  Patient reassessed.  Feels improved currently.  Oxygen 93% on RA while sitting.  Given his extensive multifocal pneumonia will mid to medicine for IV antibiotics  Discussed with Dr. Lou with medicine agreeable to eval patient for admission  Discussed plan with patient.  He is agreeable to admit.  The patient appears reasonably stabilized for admission considering the current resources, flow, and capabilities available in the ED at this time, and I doubt any other [facility]  requiring further screening and/or treatment in the ED prior to admission.                                    Medical Decision Making Amount and/or Complexity of Data Reviewed External Data Reviewed: labs, radiology, ECG and notes. Labs: ordered. Decision-making details documented in ED Course. Radiology: ordered and independent interpretation performed. Decision-making details documented in ED Course. ECG/medicine tests: ordered and independent interpretation performed. Decision-making details documented in ED Course.  Risk OTC drugs. Prescription drug management. Parenteral controlled substances. Decision regarding hospitalization. Diagnosis or treatment significantly limited by social determinants of health.        Final diagnoses:  Multifocal pneumonia  Hemoptysis    ED Discharge Orders     None          Enriqueta Augusta A, PA-C 09/19/23 2202    Pamella Ozell LABOR, DO 09/21/23 [phone]
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
## 2023-09-19 NOTE — ED Triage Notes (Incomplete)
 Pt BIB EMS for coughing up blood. Pt has been having headaches, cold sweats and fever x1 week. Pt was told to get evaluated for TB since he was coughing up blood and tested negative for covid/flu.

## 2023-09-20 DIAGNOSIS — J189 Pneumonia, unspecified organism: Secondary | ICD-10-CM | POA: Diagnosis not present

## 2023-09-20 DIAGNOSIS — R042 Hemoptysis: Secondary | ICD-10-CM

## 2023-09-20 LAB — COMPREHENSIVE METABOLIC PANEL WITH GFR
ALT: 213 U/L — ABNORMAL HIGH (ref 0–44)
AST: 284 U/L — ABNORMAL HIGH (ref 15–41)
Albumin: 2.1 g/dL — ABNORMAL LOW (ref 3.5–5.0)
Alkaline Phosphatase: 96 U/L (ref 38–126)
Anion gap: 7 (ref 5–15)
BUN: 17 mg/dL (ref 6–20)
CO2: 26 mmol/L (ref 22–32)
Calcium: 8 mg/dL — ABNORMAL LOW (ref 8.9–10.3)
Chloride: 100 mmol/L (ref 98–111)
Creatinine, Ser: 0.85 mg/dL (ref 0.61–1.24)
GFR, Estimated: >60 mL/min (ref >60)
Glucose, Bld: 121 mg/dL — ABNORMAL HIGH (ref 70–99)
Potassium: 4.3 mmol/L (ref 3.5–5.1)
Sodium: 133 mmol/L — ABNORMAL LOW (ref 135–145)
Total Bilirubin: 0.8 mg/dL (ref 0.0–1.2)
Total Protein: 5.8 g/dL — ABNORMAL LOW (ref 6.5–8.1)

## 2023-09-20 LAB — RESPIRATORY PANEL BY PCR

## 2023-09-20 LAB — STREP PNEUMONIAE URINARY ANTIGEN: Strep Pneumo Urinary Antigen: NEGATIVE

## 2023-09-20 LAB — CBC
HCT: 40.6 % (ref 39.0–52.0)
Hemoglobin: 13.2 g/dL (ref 13.0–17.0)
MCH: 32.3 pg (ref 26.0–34.0)
MCHC: 32.5 g/dL (ref 30.0–36.0)
MCV: 99.3 fL (ref 80.0–100.0)
Platelets: 221 K/uL (ref 150–400)
RBC: 4.09 MIL/uL — ABNORMAL LOW (ref 4.22–5.81)
RDW: 13.8 % (ref 11.5–15.5)
WBC: 7.8 K/uL (ref 4.0–10.5)
nRBC: 0 % (ref 0.0–0.2)

## 2023-09-20 LAB — PROCALCITONIN: Procalcitonin: 0.54 ng/mL

## 2023-09-20 LAB — HIV ANTIBODY (ROUTINE TESTING W REFLEX): HIV Screen 4th Generation wRfx: NONREACTIVE

## 2023-09-20 MED ORDER — ORAL CARE MOUTH RINSE: 15.0000 mL | OROMUCOSAL | Status: DC | PRN

## 2023-09-20 MED ORDER — NICOTINE 14 MG/24HR TD PT24
14.0000 mg | MEDICATED_PATCH | Freq: Every day | TRANSDERMAL | Status: DC
  Administered 2023-09-20: 14 mg via TRANSDERMAL
  Filled 2023-09-20: qty 1

## 2023-09-20 MED ORDER — DM-GUAIFENESIN ER 30-600 MG PO TB12
1.0000 | ORAL_TABLET | Freq: Two times a day (BID) | ORAL | Status: DC
  Administered 2023-09-20 (×2): 1 via ORAL
  Filled 2023-09-20 (×2): qty 1

## 2023-09-20 MED ORDER — AMOXICILLIN-POT CLAVULANATE 875-125 MG PO TABS: 1.0000 | ORAL_TABLET | Freq: Two times a day (BID) | ORAL | 0 refills | Status: DC

## 2023-09-20 MED ORDER — ASPIRIN 81 MG PO TBEC
81.0000 mg | DELAYED_RELEASE_TABLET | Freq: Every day | ORAL | Status: DC
  Administered 2023-09-20: 81 mg via ORAL
  Filled 2023-09-20: qty 1

## 2023-09-20 MED ORDER — DOXYCYCLINE HYCLATE 100 MG PO TABS: 100.0000 mg | ORAL_TABLET | Freq: Two times a day (BID) | ORAL | 0 refills | Status: AC

## 2023-09-20 MED ORDER — AMLODIPINE BESYLATE 5 MG PO TABS
10.0000 mg | ORAL_TABLET | Freq: Every day | ORAL | Status: DC
  Administered 2023-09-20: 10 mg via ORAL
  Filled 2023-09-20: qty 2

## 2023-09-20 MED ORDER — SODIUM CHLORIDE 0.9 % IV SOLN: 1.0000 g | INTRAVENOUS | Status: DC

## 2023-09-20 MED ORDER — DOXYCYCLINE HYCLATE 100 MG PO TABS
100.0000 mg | ORAL_TABLET | Freq: Two times a day (BID) | ORAL | Status: AC
  Administered 2023-09-20: 100 mg via ORAL
  Filled 2023-09-20: qty 1

## 2023-09-20 MED ORDER — AMOXICILLIN-POT CLAVULANATE 875-125 MG PO TABS
1.0000 | ORAL_TABLET | Freq: Two times a day (BID) | ORAL | Status: AC
  Administered 2023-09-20: 1 via ORAL
  Filled 2023-09-20: qty 1

## 2023-09-20 MED ORDER — ADULT MULTIVITAMIN W/MINERALS CH
1.0000 | ORAL_TABLET | Freq: Every day | ORAL | Status: DC
  Administered 2023-09-20: 1 via ORAL
  Filled 2023-09-20: qty 1

## 2023-09-20 MED ORDER — FLUTICASONE PROPIONATE 50 MCG/ACT NA SUSP: 1.0000 | Freq: Two times a day (BID) | NASAL | Status: DC | PRN

## 2023-09-20 MED ORDER — AMOXICILLIN-POT CLAVULANATE 875-125 MG PO TABS: 1.0000 | ORAL_TABLET | Freq: Two times a day (BID) | ORAL | 0 refills | Status: AC

## 2023-09-20 MED ORDER — AZITHROMYCIN 250 MG PO TABS
500.0000 mg | ORAL_TABLET | Freq: Every day | ORAL | Status: DC
  Administered 2023-09-20: 500 mg via ORAL
  Filled 2023-09-20: qty 2

## 2023-09-20 MED ORDER — DOXYCYCLINE HYCLATE 100 MG PO TABS: 100.0000 mg | ORAL_TABLET | Freq: Two times a day (BID) | ORAL | 0 refills | Status: DC

## 2023-09-20 MED ORDER — LORATADINE 10 MG PO TABS: 10.0000 mg | ORAL_TABLET | Freq: Every day | ORAL | Status: DC | PRN

## 2023-09-20 NOTE — Discharge Summary (Signed)
 Physician Discharge Summary   Patient: Christopher Sheppard MRN: [id number] DOB: 07-20-1975  Admit date:     09/19/2023  Discharge date: 09/20/23  Discharge Physician: Garnette Pelt   PCP: Patient, No Pcp Per   Recommendations at discharge:    Follow up with PCP in 1-2 weeks  Discharge Diagnoses: Principal Problem:   Multifocal pneumonia Active Problems:   Hemoptysis  Resolved Problems:   * No resolved hospital problems. *  Hospital Course: 48 y.o. male with medical history significant for hypertrophic cardiomyopathy and hypertension who presents to the ED for evaluation of shortness of breath, hemoptysis and URI symptoms.  Patient reports that since last Friday, he has had intermittent chills, fever and headache.  He was evaluated at urgent care on Monday and tested negative for COVID.  His symptoms has progressed over the last 2 days he has started having a productive cough with sputum noticed occasionally mixed with some blood looks like tomato pieces.  He endorsed poor appetite with decreased p.o. intake and unintentional weight loss over the last week. He denies any chest pain, shortness of breath, abdominal pain, nausea, vomiting or dysuria. States he is currently living in the basement of a friend's house. He has not had any encounter to the homeless or prison population.  Has not had any recent travel outside the country.   ED Course: Initial vitals show temp 101.3, RR 18, HR 84, BP 124/69, SpO2 96% on room air. Initial labs significant for sodium 131, glucose 127, creatinine 0.84, albumin 2.5, AST/ALT 239/159, troponin 18, lactic acid 1.0, WBC 8.4, Hgb 14.2, PT/INR 17.2/1.3, negative flu, RSV, group A strep and COVID test. EKG shows sinus rhythm with LAE, LVH and prolonged QTc of 502. CXR shows extensive right lung pneumonia.  CTA chest PE study negative for PE but shows evidence of multifocal pneumonia and cardiomegaly.  CT A/P shows hepatic steatosis but no acute intra-abdominal or pelvic  abnormality.  Pt received IV LR 1 L bolus, IV morphine  4 mg x 1, IV Toradol  50 mg x 1, IV Rocephin  and IV azithromycin . TRH was consulted for admission.   Assessment and Plan: # Multifocal pneumonia - Pt presented with 1 week of progressive URI symptoms - Chest imaging shows evidence of multifocal pneumonia with no cavitary lesions - Markedly improved with IV Rocephin  and azithromycin  -fevers resolved -Discussed case with ID who feels pt is low risk for having TB, especially as pt is clinically much improved with treatment for PNA and not TB -Removed isolation -Will complete course with doxy and augmentin  on d/c   # Hypertrophic cardiomyopathy - TTE earlier this year showed HOCM with SAM and moderate mitral regurgitation - Cardiac MRI showed septal thickness of 2.1 cm in resting LVOT gradient of 29 mmHg but no fibrosis - Follows with [facility] surgery, they recommend monitoring for now   # HTN - BP stable with SBP in the 110s to 120s - Continue amlodipine   ETOH abuse -Cessation done at bedside  Alcoholic hepatitis -elevated AST/ALT with ratio suggeesting ETOH abuse -Remained stable       Consultants: discussed case with ID Procedures performed:   Disposition: Home Diet recommendation:  Regular diet DISCHARGE MEDICATION: Allergies as of 09/20/2023       Reactions   Bee Venom    Nickel Hives        Medication List     TAKE these medications    amLODipine  10 MG tablet Commonly known as: NORVASC  Take 10 mg by mouth  daily.   amoxicillin -clavulanate 875-125 MG tablet Commonly known as: AUGMENTIN  Take 1 tablet by mouth 2 (two) times daily for 5 days.   aspirin  EC 81 MG tablet Take 81 mg by mouth daily. Swallow whole.   buPROPion 150 MG 12 hr tablet Commonly known as: WELLBUTRIN SR Take 150 mg by mouth as directed.  TAKE 1 TABLET BY MOUTH nightly FOR 7 DAYS, THEN TAKE 1 TABLET 2 TIMES DAILY thereafter. IF stopped abruptly MAY cause wellbutrin  withdrawal   doxycycline  100 MG tablet Commonly known as: VIBRA -TABS Take 1 tablet (100 mg total) by mouth 2 (two) times daily for 5 days.   fluticasone  50 MCG/ACT nasal spray Commonly known as: FLONASE  Place 1 spray into both nostrils in the morning and at bedtime.   ibuprofen  800 MG tablet Commonly known as: ADVIL  Take 1 tablet (800 mg total) by mouth every 8 (eight) hours as needed for mild pain or moderate pain.   loratadine  10 MG tablet Commonly known as: CLARITIN  Take 10 mg by mouth daily as needed for allergies.   MULTIVITAMIN ADULT PO Take 1 tablet by mouth daily at 6 (six) AM.        Follow-up Information     Follow up with your PCP in 1-2 weeks Follow up in 2 week(s).   Why: Hospital follow up               Discharge Exam: Filed Weights   09/20/23 0111 09/20/23 0330  Weight: 77.5 kg 77.5 kg   General exam: Awake, laying in bed, in nad Respiratory system: Normal respiratory effort, no wheezing Cardiovascular system: regular rate, s1, s2 Gastrointestinal system: Soft, nondistended, positive BS Central nervous system: CN2-12 grossly intact, strength intact Extremities: Perfused, no clubbing Skin: Normal skin turgor, no notable skin lesions seen Psychiatry: Mood normal // no visual hallucinations   Condition at discharge: fair  The results of significant diagnostics from this hospitalization (including imaging, microbiology, ancillary and laboratory) are listed below for reference.   Imaging Studies: CT ABDOMEN PELVIS W CONTRAST Result Date: 09/19/2023 CLINICAL DATA:  Abdomen pain elevated LFT EXAM: CT ABDOMEN AND PELVIS WITH CONTRAST TECHNIQUE: Multidetector CT imaging of the abdomen and pelvis was performed using the standard protocol following bolus administration of intravenous contrast. RADIATION DOSE REDUCTION: This exam was performed according to the departmental dose-optimization program which includes automated exposure control, adjustment of  the mA and/or kV according to patient size and/or use of iterative reconstruction technique. CONTRAST:  75mL OMNIPAQUE  IOHEXOL  350 MG/ML SOLN COMPARISON:  CT 05/22/2006 FINDINGS: Lower chest: Lung bases demonstrate extensive airspace disease in the right lower lobe. Cardiomegaly. Hepatobiliary: Hepatic steatosis. No calcified gallstone or biliary dilatation Pancreas: Unremarkable. No pancreatic ductal dilatation or surrounding inflammatory changes. Spleen: Normal in size without focal abnormality. Adrenals/Urinary Tract: Adrenal glands are normal. No hydronephrosis. Atrophy and scarring at the lower pole of left kidney, likely related to sequela of remote posttraumatic injury. Slightly thick walled 11 mm complex cystic area at the midportion of left kidney, with linear bandlike density extending towards the left posterior paraspinal region. Urinary bladder is unremarkable. Stomach/Bowel: Stomach nonenlarged. Postsurgical changes of the jejunum. Postsurgical changes of the transverse colon. No acute bowel inflammatory process. Negative appendix. Vascular/Lymphatic: Aortic atherosclerosis. No enlarged abdominal or pelvic lymph nodes. Reproductive: Prostate is unremarkable. Other: Negative for pelvic effusion or free air. Musculoskeletal: No acute or suspicious osseous abnormality. IMPRESSION: 1. No CT evidence for acute intra-abdominal or pelvic abnormality. 2. Hepatic steatosis. 3. Atrophy and scarring  at the lower pole of left kidney, likely related to sequela of remote posttraumatic injury as was seen on the exam from 2008. Slightly thick walled 11 mm complex cystic area at the midportion of left kidney, with linear bandlike density extending towards the left posterior paraspinal region, suspect that this is also related to sequela of remote trauma but there are no interval exams to assess for recent stability. Recommend comparison with interval exams if available, otherwise short-term cross-sectional imaging  follow-up could be considered to ensure stable finding. 4. Extensive airspace disease in the right lower lobe consistent with pneumonia. Cardiomegaly. 5. Aortic atherosclerosis. Aortic Atherosclerosis (ICD10-I70.0). Electronically Signed   By: Luke Bun M.D.   On: 09/19/2023 20:07   CT Angio Chest PE W and/or Wo Contrast Result Date: 09/19/2023 CLINICAL DATA:  Chest pain hemoptysis EXAM: CT ANGIOGRAPHY CHEST WITH CONTRAST TECHNIQUE: Multidetector CT imaging of the chest was performed using the standard protocol during bolus administration of intravenous contrast. Multiplanar CT image reconstructions and MIPs were obtained to evaluate the vascular anatomy. RADIATION DOSE REDUCTION: This exam was performed according to the departmental dose-optimization program which includes automated exposure control, adjustment of the mA and/or kV according to patient size and/or use of iterative reconstruction technique. CONTRAST:  75mL OMNIPAQUE  IOHEXOL  350 MG/ML SOLN COMPARISON:  Chest x-ray 09/19/2023 FINDINGS: Cardiovascular: Satisfactory opacification of the pulmonary arteries to the segmental level. No evidence of pulmonary embolism. Cardiomegaly. No pericardial effusion. Nonaneurysmal aorta. Mediastinum/Nodes: Patent trachea. No thyroid mass. Multiple borderline to mildly enlarged mediastinal lymph nodes. Right paratracheal nodes measuring up to 11 mm. Esophagus within normal limits. Lungs/Pleura: Extensive consolidation and patchy areas of ground-glass disease throughout the right lower lobe. Additional smaller areas of consolidation and ground-glass disease in the right upper lobe with mild peribronchovascular nodularity and ground-glass disease at the right middle lobe. Left lung grossly clear. No significant pleural effusion. Upper Abdomen: No acute finding. Musculoskeletal: No acute osseous abnormality. Review of the MIP images confirms the above findings. IMPRESSION: 1. Negative for acute pulmonary embolus. 2.  Extensive consolidation and patchy areas of ground-glass disease throughout the right lower lobe with additional smaller areas of consolidation and ground-glass disease in the right upper lobe and right middle lobe. Findings are suspicious for multifocal pneumonia. 3. Cardiomegaly. 4. Multiple borderline to mildly enlarged mediastinal lymph nodes, likely reactive. Electronically Signed   By: Luke Bun M.D.   On: 09/19/2023 19:56   DG Chest Portable 1 View Result Date: 09/19/2023 CLINICAL DATA:  Cough, hemoptysis and weight loss. EXAM: PORTABLE CHEST 1 VIEW COMPARISON:  05/25/2006. FINDINGS: Enlarged cardiac silhouette. Extensive patchy opacity in the lower half of the right lung. Clear left lung. Normal-appearing bones. IMPRESSION: 1. Extensive right lung pneumonia. 2. Cardiomegaly. Electronically Signed   By: Elspeth Bathe M.D.   On: 09/19/2023 18:24    Microbiology: Results for orders placed or performed during the hospital encounter of 09/19/23  Group A Strep by PCR     Status: None   Collection Time: 09/19/23  5:46 PM   Specimen: Sterile Swab  Result Value Ref Range Status   Group A Strep by PCR NOT DETECTED NOT DETECTED Final    Comment: Performed at [facility], [address] [place], [place] [address]  Resp panel by RT-PCR (RSV, Flu A&B, Covid) Anterior Nasal Swab     Status: None   Collection Time: 09/19/23  5:46 PM   Specimen: Anterior Nasal Swab  Result Value Ref Range Status   SARS  Coronavirus 2 by RT PCR NEGATIVE NEGATIVE Final    Comment: (NOTE) SARS-CoV-2 target nucleic acids are NOT DETECTED.  The SARS-CoV-2 RNA is generally detectable in upper respiratory specimens during the acute phase of infection. The lowest concentration of SARS-CoV-2 viral copies this assay can detect is 138 copies/mL. A negative result does not preclude SARS-Cov-2 infection and should not be used as the sole basis for treatment or other patient management decisions. A  negative result may occur with  improper specimen collection/handling, submission of specimen other than nasopharyngeal swab, presence of viral mutation(s) within the areas targeted by this assay, and inadequate number of viral copies(<138 copies/mL). A negative result must be combined with clinical observations, patient history, and epidemiological information. The expected result is Negative.  Fact Sheet for Patients:  BloggerCourse.com  Fact Sheet for Healthcare Providers:  SeriousBroker.it  This test is no t yet approved or cleared by the [place]  FDA and  has been authorized for detection and/or diagnosis of SARS-CoV-2 by FDA under an Emergency Use Authorization (EUA). This EUA will remain  in effect (meaning this test can be used) for the duration of the COVID-19 declaration under Section 564(b)(1) of the Act, 21 U.S.C.section 360bbb-3(b)(1), unless the authorization is terminated  or revoked sooner.       Influenza A by PCR NEGATIVE NEGATIVE Final   Influenza B by PCR NEGATIVE NEGATIVE Final    Comment: (NOTE) The Xpert Xpress SARS-CoV-2/FLU/RSV plus assay is intended as an aid in the diagnosis of influenza from Nasopharyngeal swab specimens and should not be used as a sole basis for treatment. Nasal washings and aspirates are unacceptable for Xpert Xpress SARS-CoV-2/FLU/RSV testing.  Fact Sheet for Patients: BloggerCourse.com  Fact Sheet for Healthcare Providers: SeriousBroker.it  This test is not yet approved or cleared by the [place]  FDA and has been authorized for detection and/or diagnosis of SARS-CoV-2 by FDA under an Emergency Use Authorization (EUA). This EUA will remain in effect (meaning this test can be used) for the duration of the COVID-19 declaration under Section 564(b)(1) of the Act, 21 U.S.C. section 360bbb-3(b)(1), unless the authorization  is terminated or revoked.     Resp Syncytial Virus by PCR NEGATIVE NEGATIVE Final    Comment: (NOTE) Fact Sheet for Patients: BloggerCourse.com  Fact Sheet for Healthcare Providers: SeriousBroker.it  This test is not yet approved or cleared by the [place]  FDA and has been authorized for detection and/or diagnosis of SARS-CoV-2 by FDA under an Emergency Use Authorization (EUA). This EUA will remain in effect (meaning this test can be used) for the duration of the COVID-19 declaration under Section 564(b)(1) of the Act, 21 U.S.C. section 360bbb-3(b)(1), unless the authorization is terminated or revoked.  Performed at [facility], [address] [place], [place] [address]   Respiratory (~20 pathogens) panel by PCR     Status: None   Collection Time: 09/19/23  5:46 PM   Specimen: Nasopharyngeal Swab; Respiratory  Result Value Ref Range Status   Adenovirus NOT DETECTED NOT DETECTED Final   Coronavirus 229E NOT DETECTED NOT DETECTED Final    Comment: (NOTE) The Coronavirus on the Respiratory Panel, DOES NOT test for the novel  Coronavirus (2019 nCoV)    Coronavirus HKU1 NOT DETECTED NOT DETECTED Final   Coronavirus NL63 NOT DETECTED NOT DETECTED Final   Coronavirus OC43 NOT DETECTED NOT DETECTED Final   Metapneumovirus NOT DETECTED NOT DETECTED Final   Rhinovirus / Enterovirus NOT DETECTED NOT DETECTED Final   Influenza  A NOT DETECTED NOT DETECTED Final   Influenza B NOT DETECTED NOT DETECTED Final   Parainfluenza Virus 1 NOT DETECTED NOT DETECTED Final   Parainfluenza Virus 2 NOT DETECTED NOT DETECTED Final   Parainfluenza Virus 3 NOT DETECTED NOT DETECTED Final   Parainfluenza Virus 4 NOT DETECTED NOT DETECTED Final   Respiratory Syncytial Virus NOT DETECTED NOT DETECTED Final   Bordetella pertussis NOT DETECTED NOT DETECTED Final   Bordetella Parapertussis NOT DETECTED NOT DETECTED Final    Chlamydophila pneumoniae NOT DETECTED NOT DETECTED Final   Mycoplasma pneumoniae NOT DETECTED NOT DETECTED Final    Comment: Performed at [facility], [address] [place], [place] [address]    Labs: CBC: Recent Labs  Lab 09/19/23 1803 09/20/23 0358  WBC 8.4 7.8  NEUTROABS 6.5  --   HGB 14.2 13.2  HCT 42.1 40.6  MCV 97.5 99.3  PLT 197 221   Basic Metabolic Panel: Recent Labs  Lab 09/19/23 1803 09/20/23 0358  NA 131* 133*  K 3.6 4.3  CL 98 100  CO2 22 26  GLUCOSE 127* 121*  BUN 18 17  CREATININE 0.84 0.85  CALCIUM 8.2* 8.0*  MG 2.4  --    Liver Function Tests: Recent Labs  Lab 09/19/23 1803 09/20/23 0358  AST 239* 284*  ALT 159* 213*  ALKPHOS 93 96  BILITOT 1.0 0.8  PROT 6.7 5.8*  ALBUMIN 2.5* 2.1*   CBG: No results for input(s): GLUCAP in the last 168 hours.  Discharge time spent: less than 30 minutes.  Signed: Garnette Pelt, MD [facility] 09/20/2023

## 2023-09-20 NOTE — TOC Initial Note (Signed)
 Transition of Care [facility]) - Initial/Assessment Note    Patient Details  Name: Christopher Sheppard MRN: [id number] Date of Birth: 01-28-1976  [facility] CM/SW Contact:    Sonda Manuella Quill, RN Phone Number: 09/20/2023, 5:22 PM  Clinical Narrative:                 [facility] consulted for medication assistance; no PCP listed; spoke w/ pt over phone; pt says he lives at home; he plans to return at d/c; pt identified POC Nolon Bihari (friend) [phone]; he has arranged transportation; pt verified insurance; he said he is seen at [facility]; pt does not have DME, HH services, or home oxygen; pt does not qualify for [facility] as he has insurance; pt said his friend will pick up medication for him at [facility] [address] [place], [place]; no TOC needs.  Expected Discharge Plan: Home/Self Care Barriers to Discharge: No Barriers Identified   Patient Goals and CMS Choice Patient states their goals for this hospitalization and ongoing recovery are:: home          Expected Discharge Plan and Services   Discharge Planning Services: CM Consult   Living arrangements for the past 2 months: Single Family Home                 DME Arranged: N/A DME Agency: NA       HH Arranged: NA HH Agency: NA        Prior Living Arrangements/Services Living arrangements for the past 2 months: Single Family Home Lives with:: Roommate Patient language and need for interpreter reviewed:: Yes Do you feel safe going back to the place where you live?: Yes      Need for Family Participation in Patient Care: Yes (Comment) Care giver support system in place?: Yes (comment) Current home services:  (n/a) Criminal Activity/Legal Involvement Pertinent to Current Situation/Hospitalization: No - Comment as needed  Activities of Daily Living   ADL Screening (condition at time of admission) Independently performs ADLs?: Yes (appropriate for developmental age) Is the patient deaf or have difficulty  hearing?: No Does the patient have difficulty seeing, even when wearing glasses/contacts?: No Does the patient have difficulty concentrating, remembering, or making decisions?: No  Permission Sought/Granted Permission sought to share information with : Case Manager Permission granted to share information with : Yes, Verbal Permission Granted  Share Information with NAME: Case Manager     Permission granted to share info w Relationship: Nolon Bihari (friend) [phone]     Emotional Assessment Appearance:: Other (Comment Required (unable to assess) Attitude/Demeanor/Rapport: Gracious Affect (typically observed): Accepting Orientation: : Oriented to Self, Oriented to Place, Oriented to  Time, Oriented to Situation Alcohol / Substance Use: Not Applicable Psych Involvement: No (comment)  Admission diagnosis:  Hemoptysis [R04.2] Multifocal pneumonia [J18.9] Patient Active Problem List   Diagnosis Date Noted   Hemoptysis 09/20/2023   Multifocal pneumonia 09/19/2023   PCP:  Patient, No Pcp Per Pharmacy:   [facility], [place] - [address] [place] [address] Phone: [phone] Fax: [phone]     Social Drivers of Health (SDOH) Social History: SDOH Screenings   Food Insecurity: No Food Insecurity (09/20/2023)  Housing: Low Risk  (09/20/2023)  Transportation Needs: No Transportation Needs (09/20/2023)  Recent Concern: Transportation Needs - Unmet Transportation Needs (09/20/2023)  Utilities: Not At Risk (09/20/2023)  Financial Resource Strain: Not on File (01/10/2023)   Received from [facility]  Physical Activity: Not on File (  01/10/2023)   Received from [facility]  Social Connections: Not on File (01/10/2023)   Received from [facility]  Stress: Not on File (01/10/2023)   Received from [facility]  Tobacco Use: High Risk (09/19/2023)   SDOH Interventions: Food Insecurity Interventions: Intervention Not Indicated, Inpatient TOC Housing  Interventions: Intervention Not Indicated, Inpatient TOC Transportation Interventions: Intervention Not Indicated, Inpatient TOC Utilities Interventions: Intervention Not Indicated, Inpatient TOC   Readmission Risk Interventions     No data to display

## 2023-09-20 NOTE — Progress Notes (Signed)
 Pt eagerly awaiting discharge. PO Augmentin  and doxycycline  given prior to d/c per discharge instructions. Pt verbalized understanding of medication schedule. Pt walked downstairs to hospital entrance with RN. Pt axox4 and asked if he could to walk to bus stop where his ride could pick him up. Pt with stable gait- walked to bus stop.

## 2023-09-20 NOTE — Hospital Course (Signed)
 48 y.o. male with medical history significant for hypertrophic cardiomyopathy and hypertension who presents to the ED for evaluation of shortness of breath, hemoptysis and URI symptoms.  Patient reports that since last Friday, he has had intermittent chills, fever and headache.  He was evaluated at urgent care on Monday and tested negative for COVID.  His symptoms has progressed over the last 2 days he has started having a productive cough with sputum noticed occasionally mixed with some blood looks like tomato pieces.  He endorsed poor appetite with decreased p.o. intake and unintentional weight loss over the last week. He denies any chest pain, shortness of breath, abdominal pain, nausea, vomiting or dysuria. States he is currently living in the basement of a friend's house. He has not had any encounter to the homeless or prison population.  Has not had any recent travel outside the country.   ED Course: Initial vitals show temp 101.3, RR 18, HR 84, BP 124/69, SpO2 96% on room air. Initial labs significant for sodium 131, glucose 127, creatinine 0.84, albumin 2.5, AST/ALT 239/159, troponin 18, lactic acid 1.0, WBC 8.4, Hgb 14.2, PT/INR 17.2/1.3, negative flu, RSV, group A strep and COVID test. EKG shows sinus rhythm with LAE, LVH and prolonged QTc of 502. CXR shows extensive right lung pneumonia.  CTA chest PE study negative for PE but shows evidence of multifocal pneumonia and cardiomegaly.  CT A/P shows hepatic steatosis but no acute intra-abdominal or pelvic abnormality.  Pt received IV LR 1 L bolus, IV morphine  4 mg x 1, IV Toradol  50 mg x 1, IV Rocephin  and IV azithromycin . TRH was consulted for admission.

## 2023-09-20 NOTE — Plan of Care (Signed)

## 2023-09-21 LAB — LEGIONELLA PNEUMOPHILA SEROGP 1 UR AG: L. pneumophila Serogp 1 Ur Ag: NEGATIVE

## 2023-09-25 LAB — CULTURE, BLOOD (ROUTINE X 2)
Culture: NO GROWTH
Culture: NO GROWTH
Special Requests: ADEQUATE
Special Requests: ADEQUATE

## 2023-09-25 LAB — QUANTIFERON-TB GOLD PLUS
